# Patient Record
Sex: Male | Born: 1980 | Race: White | Hispanic: No | Marital: Married | Smoking: Current every day smoker
Health system: Southern US, Community
[De-identification: ages and names within clinical notes are randomized; demographics above are authoritative.]

## PROBLEM LIST (undated history)

## (undated) DIAGNOSIS — N289 Disorder of kidney and ureter, unspecified: Secondary | ICD-10-CM

## (undated) HISTORY — PX: NECK SURGERY: SHX720

## (undated) HISTORY — PX: BACK SURGERY: SHX140

## (undated) HISTORY — PX: KNEE SURGERY: SHX244

---

## 2006-12-25 ENCOUNTER — Inpatient Hospital Stay (HOSPITAL_COMMUNITY): Admission: EM | Admit: 2006-12-25 | Discharge: 2006-12-30 | Payer: Self-pay | Admitting: Emergency Medicine
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
## 2010-12-06 LAB — HEPATIC FUNCTION PANEL
AST: 86 — ABNORMAL HIGH
Alkaline Phosphatase: 89
Indirect Bilirubin: 0.5
Total Bilirubin: 0.8

## 2010-12-06 LAB — I-STAT 8, (EC8 V) (CONVERTED LAB)
BUN: 7
Bicarbonate: 24.8 — ABNORMAL HIGH
Chloride: 103
Glucose, Bld: 241 — ABNORMAL HIGH
Hemoglobin: 14.3
Operator id: 144801
Potassium: 3.1 — ABNORMAL LOW
Sodium: 139
TCO2: 26
pCO2, Ven: 44.3 — ABNORMAL LOW

## 2010-12-06 LAB — CBC
Hemoglobin: 8.9 — ABNORMAL LOW
RBC: 2.88 — ABNORMAL LOW
RBC: 4.3
RDW: 14.6 — ABNORMAL HIGH
WBC: 13.7 — ABNORMAL HIGH

## 2010-12-06 LAB — POCT I-STAT CREATININE
Creatinine, Ser: 0.9
Operator id: 144801

## 2010-12-06 LAB — BASIC METABOLIC PANEL
Calcium: 8.3 — ABNORMAL LOW
Creatinine, Ser: 0.73
GFR calc Af Amer: >60
GFR calc non Af Amer: >60
Glucose, Bld: 114 — ABNORMAL HIGH
Sodium: 135

## 2014-02-22 ENCOUNTER — Encounter (HOSPITAL_COMMUNITY): Payer: Self-pay | Admitting: Emergency Medicine

## 2014-02-22 ENCOUNTER — Emergency Department (HOSPITAL_COMMUNITY)
Admission: EM | Admit: 2014-02-22 | Discharge: 2014-02-23 | Disposition: A | Discharge status: Home / Self Care | Payer: Self-pay | Attending: Emergency Medicine | Admitting: Emergency Medicine

## 2014-02-22 DIAGNOSIS — F1124 Opioid dependence with opioid-induced mood disorder: Secondary | ICD-10-CM | POA: Insufficient documentation

## 2014-02-22 DIAGNOSIS — Z87448 Personal history of other diseases of urinary system: Secondary | ICD-10-CM | POA: Insufficient documentation

## 2014-02-22 DIAGNOSIS — Z720 Tobacco use: Secondary | ICD-10-CM | POA: Insufficient documentation

## 2014-02-22 DIAGNOSIS — F1129 Opioid dependence with unspecified opioid-induced disorder: Secondary | ICD-10-CM

## 2014-02-22 DIAGNOSIS — F1310 Sedative, hypnotic or anxiolytic abuse, uncomplicated: Secondary | ICD-10-CM | POA: Insufficient documentation

## 2014-02-22 DIAGNOSIS — F1410 Cocaine abuse, uncomplicated: Secondary | ICD-10-CM | POA: Insufficient documentation

## 2014-02-22 DIAGNOSIS — F1910 Other psychoactive substance abuse, uncomplicated: Secondary | ICD-10-CM

## 2014-02-22 HISTORY — DX: Disorder of kidney and ureter, unspecified: N28.9

## 2014-02-22 LAB — RAPID URINE DRUG SCREEN, HOSP PERFORMED
Amphetamines: NOT DETECTED
Barbiturates: NOT DETECTED
Benzodiazepines: POSITIVE — AB
Cocaine: POSITIVE — AB
Opiates: POSITIVE — AB
Tetrahydrocannabinol: NOT DETECTED

## 2014-02-22 LAB — CBC
HCT: 40.8 % (ref 39.0–52.0)
Hemoglobin: 13.6 g/dL (ref 13.0–17.0)
MCH: 29.8 pg (ref 26.0–34.0)
MCHC: 33.3 g/dL (ref 30.0–36.0)
MCV: 89.3 fL (ref 78.0–100.0)
Platelets: 257 10*3/uL (ref 150–400)
RBC: 4.57 MIL/uL (ref 4.22–5.81)
RDW: 13.5 % (ref 11.5–15.5)
WBC: 7.5 10*3/uL (ref 4.0–10.5)

## 2014-02-22 LAB — COMPREHENSIVE METABOLIC PANEL
ALT: 17 U/L (ref 0–53)
AST: 16 U/L (ref 0–37)
Albumin: 4.2 g/dL (ref 3.5–5.2)
Alkaline Phosphatase: 79 U/L (ref 39–117)
Anion gap: 3 — ABNORMAL LOW (ref 5–15)
BUN: 13 mg/dL (ref 6–23)
CO2: 33 mmol/L — ABNORMAL HIGH (ref 19–32)
Calcium: 9.3 mg/dL (ref 8.4–10.5)
Chloride: 105 mEq/L (ref 96–112)
Creatinine, Ser: 1.08 mg/dL (ref 0.50–1.35)
GFR calc Af Amer: >90 mL/min (ref >90)
GFR calc non Af Amer: 88 mL/min — ABNORMAL LOW (ref >90)
Glucose, Bld: 82 mg/dL (ref 70–99)
Potassium: 4.5 mmol/L (ref 3.5–5.1)
Sodium: 141 mmol/L (ref 135–145)
Total Bilirubin: 0.6 mg/dL (ref 0.3–1.2)
Total Protein: 7.1 g/dL (ref 6.0–8.3)

## 2014-02-22 LAB — SALICYLATE LEVEL: Salicylate Lvl: <4 mg/dL (ref 2.8–20.0)

## 2014-02-22 LAB — ACETAMINOPHEN LEVEL: Acetaminophen (Tylenol), Serum: <10 ug/mL — ABNORMAL LOW (ref 10–30)

## 2014-02-22 LAB — ETHANOL: Alcohol, Ethyl (B): <5 mg/dL (ref 0–9)

## 2014-02-22 MED ORDER — NICOTINE 21 MG/24HR TD PT24
MEDICATED_PATCH | TRANSDERMAL | Status: AC
  Administered 2014-02-22: 21 mg
  Filled 2014-02-22: qty 1

## 2014-02-22 MED ORDER — IBUPROFEN 400 MG PO TABS: 600.0000 mg | ORAL_TABLET | Freq: Three times a day (TID) | ORAL | Status: DC | PRN

## 2014-02-22 MED ORDER — LORAZEPAM 1 MG PO TABS
1.0000 mg | ORAL_TABLET | Freq: Three times a day (TID) | ORAL | Status: DC | PRN
  Administered 2014-02-23: 1 mg via ORAL
  Filled 2014-02-22: qty 1

## 2014-02-22 MED ORDER — ALUM & MAG HYDROXIDE-SIMETH 200-200-20 MG/5ML PO SUSP: 30.0000 mL | ORAL | Status: DC | PRN

## 2014-02-22 MED ORDER — ONDANSETRON HCL 4 MG PO TABS: 4.0000 mg | ORAL_TABLET | Freq: Three times a day (TID) | ORAL | Status: DC | PRN

## 2014-02-22 MED ORDER — ACETAMINOPHEN 325 MG PO TABS: 650.0000 mg | ORAL_TABLET | ORAL | Status: DC | PRN

## 2014-02-22 NOTE — ED Notes (Addendum)
Pt brought in by [employer] officer. Pt IVC. Pt IVC due to making SI threats to mother. Pt denies SI currently but reports "hears voices all the time." nad noted.

## 2014-02-22 NOTE — ED Provider Notes (Signed)
CSN: [id number]     Arrival date & time 02/22/14  1608 History  This chart was scribed for [name] Thinh Cuccaro, MD by Roxy Cedarhandni Bhalodia, ED Scribe. This patient was seen in room [facility] and the patient's care was started at 6:15 PM.   Chief Complaint  Patient presents with  . V70.1   Patient is a 33 y.o. male presenting with drug problem. The history is provided by the patient. No language interpreter was used.  Drug Problem This is a recurrent problem. The current episode started more than 2 days ago. The problem occurs constantly. The problem has not changed since onset.Pertinent negatives include no chest pain, no abdominal pain, no headaches and no shortness of breath. Nothing aggravates the symptoms. Nothing relieves the symptoms. He has tried nothing for the symptoms.    HPI Comments: Meribeth MattesChris Hagey is a 33 y.o. male who presents to the Emergency Department complaining of S/I that began recently History of opiate dependence for several years. Patient states he was taking methadone, morphine, suboxone. His last intake of opiate medication was last night. Patient states that he has had a history of S/I in the past and attempted to hang himself. He states that S/I are linked to opiate dependence.  Past Medical History  Diagnosis Date  . Renal disorder    Past Surgical History  Procedure Laterality Date  . Neck surgery    . Back surgery    . Knee surgery     History reviewed. No pertinent family history. History  Substance Use Topics  . Smoking status: Current Every Day Smoker -- 1.50 packs/day  . Smokeless tobacco: Not on file  . Alcohol Use: No   Review of Systems  Respiratory: Negative for shortness of breath.   Cardiovascular: Negative for chest pain.  Gastrointestinal: Negative for abdominal pain.  Neurological: Negative for headaches.  Psychiatric/Behavioral: Positive for suicidal ideas.  All other systems reviewed and are negative.  Allergies  Review of patient's  allergies indicates no known allergies.  Home Medications   Prior to Admission medications   Not on File   Triage Vitals: BP 108/83 mmHg  Pulse 74  Temp(Src) 97.9 F (36.6 C) (Oral)  Resp 16  Ht 5\' 11"  (1.803 m)  Wt 145 lb (65.772 kg)  BMI 20.23 kg/m2  SpO2 99%  Physical Exam  Constitutional: He is oriented to person, place, and time. He appears well-developed and well-nourished. No distress.  HENT:  Head: Normocephalic and atraumatic.  Eyes: Conjunctivae are normal. Right eye exhibits no discharge. Left eye exhibits no discharge.  Neck: Neck supple.  Cardiovascular: Normal rate, regular rhythm and normal heart sounds.  Exam reveals no gallop and no friction rub.   No murmur heard. Pulmonary/Chest: Effort normal and breath sounds normal. No respiratory distress.  Abdominal: Soft. He exhibits no distension. There is no tenderness.  Musculoskeletal: He exhibits no edema or tenderness.  Neurological: He is alert and oriented to person, place, and time. No cranial nerve deficit. He exhibits normal muscle tone. Coordination normal.  Skin: Skin is warm and dry.  Psychiatric: He has a normal mood and affect. His behavior is normal. Thought content normal.  Nursing note and vitals reviewed.  ED Course  Procedures (including critical care time)  DIAGNOSTIC STUDIES: Oxygen Saturation is 99% on RA, normal by my interpretation.    COORDINATION OF CARE: 6:30 PM- Discussed plans to order diagnostic lab work. Pt advised of plan for treatment and pt agrees.  Labs Review Labs Reviewed  ACETAMINOPHEN  LEVEL - Abnormal; Notable for the following:    Acetaminophen (Tylenol), Serum <10.0 (*)    All other components within normal limits  COMPREHENSIVE METABOLIC PANEL - Abnormal; Notable for the following:    CO2 33 (*)    GFR calc non Af Amer 88 (*)    Anion gap 3 (*)    All other components within normal limits  URINE RAPID DRUG SCREEN (HOSP PERFORMED) - Abnormal; Notable for the  following:    Opiates POSITIVE (*)    Cocaine POSITIVE (*)    Benzodiazepines POSITIVE (*)    All other components within normal limits  CBC  ETHANOL  SALICYLATE LEVEL   Imaging Review No results found.   EKG Interpretation None     MDM   Final diagnoses:  Opioid dependence with opioid-induced disorder  Drug abuse    33yM with long standing drug use and opiate addiction. He is not psychotic. I get the sense that his "SI" is actually more from frustration with his situation and difficulty in finding provider to prescribe him suboxone which he feels he needs as opposed true intention to harm/kill himself. Will obtain TTS consultation, but if pt should decide to leave, I do not feel he needs his IVC upheld.   I personally performed the services described in this documentation, which was scribed in my presence. The recorded information has been reviewed and is accurate.  [name] Reinette Cuneo, MD 02/23/14 [id number]

## 2014-02-22 NOTE — ED Notes (Signed)
MD at bedside. 
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
## 2014-02-22 NOTE — BH Assessment (Signed)
Called back to check if TA equipment ready.  Nurse said will be about 5 more mins.  Beryle FlockMary Rafel Garde, MS, CRC, [facility] T[facility] Specialist [facility]

## 2014-02-22 NOTE — BH Assessment (Signed)
Assessment requested and assigned  Spoke with Dr. Juleen ChinaKohut:  He advised that he thinks SI is mostly related to drug use and wanting to avoid withdrawal.  Spoke with nurse to get him set up with TA equipment.  Beryle FlockMary Obaloluwa Delatte, MS, CRC, [facility] Triage Specialist [facility]

## 2014-02-22 NOTE — ED Notes (Signed)
PT wanded by security at this time. PT's wallet locked up with security at this time.

## 2014-02-22 NOTE — BH Assessment (Signed)
Writer received consult during the shift report.  Patient will be assessed after the shift report. Writer informed TTS Corrie DandyMary of the consult.

## 2014-02-22 NOTE — BHH Counselor (Signed)
Called nurse approximately 10 mins ago to set up TA equipment for TTS assessment.  Patient was in the hallway and nurse said it would take a some time. to get set up.    Beryle FlockMary Keyli Duross, MS, CRC, [facility] Triage Specialist [facility]

## 2014-02-23 NOTE — BH Assessment (Signed)
Per Meriam, at [facility] they do have open beds.  Writer faxed referral information to [phone].  Writer informed Charge Nurse Leslie of the possible placement.  

## 2014-02-23 NOTE — BH Assessment (Signed)
Assessment completed.  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  Spoke to Inocencio Homes, [facility]:  She advised that there are no appropriate beds available. (TTS will seek appropriate bed at outside facility.)  Spoke to pt's nurse, Ivin Booty:  Advised her of the plan.    Faylene Kurtz, MS, CRC, [place] Triage Specialist [facility]

## 2014-02-23 NOTE — ED Provider Notes (Addendum)
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 Patient has been seen by TTS and they have recommended inpatient treatment. Patient has no complaints this morning but is asking something to "help with his withdrawal". He is very comfortable appearing on exam, hemodynamically stable, sleeping when I entered the room. States she's been eating and drinking well. No reports of vomiting, diarrhea, diaphoresis, agitation. At this time I do not feel he needs any benzodiazepines. Blood pressure is too low to safely give clonidine. He is awaiting placement. Labs, vitals unremarkable.  9:36 AM  [facility] accepted [facility] per Dr. Joseph ArtSubedi.  Layla MawKristen N Deah Ottaway, DO 02/23/14 [id number]  Layla MawKristen N Noemi Bellissimo, DO 02/23/14 [phone]

## 2014-02-23 NOTE — ED Notes (Signed)
[facility] transferred [facility], left with [employer] surveyorlaw enforcement officer, [employer].

## 2014-02-23 NOTE — BH Assessment (Signed)
This writer received a telephone call from Allison Lee-Shaner from [facility] who reports that patient has been accepted for admission at [facility] by Dr.Subedi. The telephone number for nursing report is [phone]. Pt's ED nurse Greg has been notified of disposition. EDP Dr.Ward notified as well.   Alnita Aybar, MS, LCASA Assessment Counselor
